# Patient Record
Sex: Female | Born: 1975 | Race: White | Hispanic: No | Smoking: Never smoker
Health system: Southern US, Community
[De-identification: ages and names within clinical notes are randomized; demographics above are authoritative.]

---

## 1998-02-23 ENCOUNTER — Other Ambulatory Visit: Admission: RE | Admit: 1998-02-23 | Discharge: 1998-02-23 | Payer: Self-pay | Admitting: Obstetrics and Gynecology

## 2001-01-25 ENCOUNTER — Other Ambulatory Visit: Admission: RE | Admit: 2001-01-25 | Discharge: 2001-01-25 | Payer: Self-pay | Admitting: Gynecology

## 2001-09-13 ENCOUNTER — Encounter: Admission: RE | Admit: 2001-09-13 | Discharge: 2001-09-13 | Payer: Self-pay | Admitting: Internal Medicine

## 2001-09-13 ENCOUNTER — Encounter: Payer: Self-pay | Admitting: Internal Medicine

## 2002-02-01 ENCOUNTER — Other Ambulatory Visit: Admission: RE | Admit: 2002-02-01 | Discharge: 2002-02-01 | Payer: Self-pay | Admitting: Gynecology

## 2003-02-24 ENCOUNTER — Other Ambulatory Visit: Admission: RE | Admit: 2003-02-24 | Discharge: 2003-02-24 | Payer: Self-pay | Admitting: Gynecology

## 2004-02-27 ENCOUNTER — Other Ambulatory Visit: Admission: RE | Admit: 2004-02-27 | Discharge: 2004-02-27 | Payer: Self-pay | Admitting: Gynecology

## 2005-02-28 ENCOUNTER — Other Ambulatory Visit: Admission: RE | Admit: 2005-02-28 | Discharge: 2005-02-28 | Payer: Self-pay | Admitting: Gynecology

## 2006-03-05 ENCOUNTER — Other Ambulatory Visit: Admission: RE | Admit: 2006-03-05 | Discharge: 2006-03-05 | Payer: Self-pay | Admitting: Gynecology

## 2007-06-24 ENCOUNTER — Inpatient Hospital Stay (HOSPITAL_COMMUNITY): Admission: AD | Admit: 2007-06-24 | Discharge: 2007-06-25 | Payer: Self-pay | Admitting: Obstetrics and Gynecology

## 2007-10-13 ENCOUNTER — Inpatient Hospital Stay (HOSPITAL_COMMUNITY): Admission: RE | Admit: 2007-10-13 | Discharge: 2007-10-16 | Payer: Self-pay | Admitting: Obstetrics and Gynecology

## 2007-10-17 ENCOUNTER — Encounter: Admission: RE | Admit: 2007-10-17 | Discharge: 2007-11-16 | Payer: Self-pay | Admitting: Obstetrics and Gynecology

## 2007-11-17 ENCOUNTER — Encounter: Admission: RE | Admit: 2007-11-17 | Discharge: 2007-12-06 | Payer: Self-pay | Admitting: Obstetrics and Gynecology

## 2008-11-22 ENCOUNTER — Emergency Department (HOSPITAL_COMMUNITY): Admission: EM | Admit: 2008-11-22 | Discharge: 2008-11-22 | Payer: Self-pay | Admitting: Emergency Medicine

## 2011-02-04 NOTE — H&P (Signed)
NAME:  Amy Griffith NO.:  [id number]   MEDICAL RECORD NO.:  [id number]          PATIENT TYPE:  INP   LOCATION:  9130                          FACILITY:  [facility]   PHYSICIAN:  Zelphia Cairo, MD    DATE OF BIRTH:  29-Jun-1976   DATE OF ADMISSION:  10/13/2007  DATE OF DISCHARGE:                              HISTORY & PHYSICAL   HISTORY OF PRESENT ILLNESS:  A 35 year old G1, P0 white female at [redacted]  weeks gestation, who presents today for a primary cesarean section. She  has a history of vestibuloplasty for treatment of dyspareunia. Her  pregnancy has otherwise been uncomplicated.   PAST MEDICAL HISTORY:  Includes migraines.   PAST SURGICAL HISTORY:  Vestibuloplasty.   ALLERGIES:  CODEINE, SULFA, IODINE.   MEDICATIONS:  Prenatal vitamins.   FAMILY HISTORY:  Noncontributory.   PRENATAL CARE/LABORATORY:  Please see [facility].   PHYSICAL EXAMINATION:  VITAL SIGNS:  Afebrile. Blood pressure 120/70.  Vital signs stable. Fetal heart tones are present.  HEART:  Regular rate and rhythm.  LUNGS:  Clear bilaterally.  ABDOMEN:  Soft, gravid, and nontender.  GENITOURINARY:  Cervical examination was deferred.  EXTREMITIES:  Without clubbing, cyanosis, or edema.   ASSESSMENT/PLAN:  A 35 year old G1, P0 with history of vestibuloplasty.  Risks, benefits, and alternatives to primary cesarean section for mode  of delivery were discussed with the patient and informed consent was  obtained.      Zelphia Cairo, MD  Electronically Signed     GA/MEDQ  D:  10/12/2007  T:  10/12/2007  Job:  [id number]

## 2011-02-04 NOTE — Discharge Summary (Signed)
NAME:  Amy Griffith, Amy Griffith           ACCOUNT NO.:  [id number]   MEDICAL RECORD NO.:  [id number]          PATIENT TYPE:  INP   LOCATION:  9130                          [facility]:  [facility]   PHYSICIAN:  Michelle L. Grewal, M.D.DATE OF BIRTH:  15-Jul-1976   DATE OF ADMISSION:  10/13/2007  DATE OF DISCHARGE:  10/16/2007                               DISCHARGE SUMMARY   ADMITTING DIAGNOSES:  1. Intrauterine pregnancy at 20 weeks estimated gestational age.  2. History of dyspareunia subsequent with vestibuloplasty.   DISCHARGE DIAGNOSES:  1. Status post low transverse cesarean section.  2. Viable female infant.   PROCEDURE:  Primary low transverse cesarean section.   REASON FOR ADMISSION:  See dictated H and P.   HOSPITAL COURSE:  The patient is a 35 year old primigravida who was  admitted to [facility] at 39 weeks estimated gestational age for  scheduled cesarean section.  The patient had a history of a  vestibuloplasty for treatment of dyspareunia and had desired a primary  cesarean delivery.  On the morning of admission the patient was taken to  the operating room where spinal anesthesia was administered without  difficulty.  A low transverse incision was made with delivery of a  viable female infant with Apgar of 9 at one minute and 9 at five  minutes.  Arterial cord pH was 7.35.  The patient tolerated the  procedure well and was taken to the recovery room in stable condition.   On postoperative day 1 the patient was without complaint.  Vital signs  are stable.  She is afebrile.  Abdomen soft, fundus firm and nontender.  Abdominal dressing is noted to be clean, dry, and intact.  Foley had  been discontinued and the patient was voiding well.  Laboratory findings  revealed hemoglobin of 9.5, platelets of 134,000, WBC count of 11.2.   On postoperative day 2 the patient was without complaints.  Vital signs  were showing stable.  She was afebrile.  Fundus firm and nontender.  Incision  was clean, dry and intact.  She is ambulating well, tolerating  a regular diet without complaints of nausea and vomiting.  On  postoperative day #3 the patient was without complaints, vital signs  remained stable.  She is afebrile.  Fundus firm and nontender.  Incision  was clean, dry and intact.  The staples removed.  The patient was later  discharged home.   CONDITION ON DISCHARGE:  Stable.   Diet, regular as tolerated.  Activity, no heavy lifting, no driving x2  weeks.  No vaginal entry.   FOLLOWUP:  The patient to follow up in the office in 1-2 weeks for  incision check.  She is to call for temperature greater than 100  degrees, persistent vomiting, heavy vaginal bleeding, and/or redness or  drainage from the incisional site.   DISCHARGE MEDICATIONS:  1. Tylox #30, 1 p.o. every 4-6 h. p.r.n.  2. __________.  3. Prenatal vitamins 1 p.o. daily.  4. Colace 1 p.o. daily.      Julio Sicks, N.P.      Stann Mainland. Vincente Poli, M.D.  Electronically Signed  CC/MEDQ  D:  10/28/2007  T:  10/28/2007  Job:  [id number]

## 2011-02-04 NOTE — Op Note (Signed)
NAME:  Amy Griffith, Amy Griffith           ACCOUNT NO.:  [id number]   MEDICAL RECORD NO.:  [id number]          PATIENT TYPE:  INP   LOCATION:  9130                          [facility]   PHYSICIAN:  Zelphia Cairo, MD    DATE OF BIRTH:  08-26-1976   DATE OF PROCEDURE:  10/13/2007  DATE OF DISCHARGE:                               OPERATIVE REPORT   PREOPERATIVE DIAGNOSES:  1. History of perineoplasty.  2. Intrauterine pregnancy at 39 weeks.   POSTOPERATIVE DIAGNOSES:  1. History of perineoplasty.  2. Intrauterine pregnancy at 39 weeks.   PROCEDURE:  Primary low transverse cesarean delivery.   SURGEON:  Zelphia Cairo, MD   ANESTHESIA:  Spinal.   FINDINGS:  Viable female infant with Apgars of 9 and 9, cord pH of 7.35,  posterior pedunculated uterine fibroid, normal-appearing bilateral  adnexa.   ESTIMATED BLOOD LOSS:  500 mL.   URINE OUTPUT:  300 mL.   COMPLICATIONS:  None.   CONDITION:  Condition stable to recovery room.   PROCEDURE:  The patient was taken to the operating room where spinal  anesthesia was found to be adequate.  She was placed in the supine  position with a left tilt.  She was prepped and draped in sterile  fashion and a Foley catheter was inserted sterilely.  Pfannenstiel skin  incision was made with a scalpel and this was extended to the underlying  fascia.  The fascia was incised in the midline.  This was extended  laterally with Mayo scissors.  Kocher clamps were used to grasp the  anterior portion of the fascia.  This was tented upwards and the  underlying rectus muscles were dissected off using the Bovie.  The  inferior portion of the fascia was grasped with Kocher clamps, tented  upwards and the underlying rectus muscles were dissected off using the  Bovie.  Peritoneum was then identified and tented upwards with  hemostats.  The peritoneum was then entered sharply with Metzenbaum  scissors.  This was extended superiorly and inferiorly with good  visualization of the bladder.  Bladder blade was then inserted.  Vesicouterine peritoneum was incised and the bladder flap was created.  Bladder blade was then reinserted to protect the bladder flap.   The uterine incision was then made with a scalpel and extended using my  fingers.  Clear fluid was noted.  Fetal vertex was brought to the  uterine incision and delivered with the aid of fundal pressure.  The  mouth and nose were suctioned and the shoulders and body easily  followed.  The cord was clamped and cut and the infant was taken to the  awaiting pediatric staff.  The placenta was then manually removed from  the uterus.  The uterus was cleared of all clots and debris using a dry  lap sponge and the uterus was exteriorized from the pelvis.  The uterus  was closed in double layer fashion using 0-0 chromic.  Once hemostasis  was assured, the uterus was placed back into the pelvic cavity and the  pelvis was copiously irrigated with warm normal saline.  The uterine  incision  was reinspected and again found to be hemostatic.  Peritoneum  was closed with 0-0 Monocryl.  The fascia was closed with a looped 0-0  PDS and the skin was closed with staples.  The patient tolerated the  procedure well.  Sponge, lap, needle and instrument counts were correct  x2.  She was taken to the recovery room in stable condition.      Zelphia Cairo, MD  Electronically Signed     GA/MEDQ  D:  10/13/2007  T:  10/13/2007  Job:  [id number]

## 2011-02-07 NOTE — Discharge Summary (Signed)
NAME:  Amy Griffith, Amy Griffith           ACCOUNT NO.:  [id number]   MEDICAL RECORD NO.:  [id number]          PATIENT TYPE:  INP   LOCATION:  9130                          [facility]   PHYSICIAN:  Michelle L. Grewal, M.D.DATE OF BIRTH:  [birth year]   DATE OF ADMISSION:  10/13/2007  DATE OF DISCHARGE:  10/16/2007                               DISCHARGE SUMMARY   NO DICTATION      Julio Sicks, N.P.      Stann Mainland. Vincente Poli, M.D.  Electronically Signed    CC/MEDQ  D:  10/28/2007  T:  10/28/2007  Job:  [id number]

## 2011-06-12 LAB — CBC
HCT: 27.2 — ABNORMAL LOW
Hemoglobin: 12.3
Hemoglobin: 9.5 — ABNORMAL LOW
MCHC: 34.9
MCV: 91.3
MCV: 91.8
RBC: 3.82 — ABNORMAL LOW
RDW: 14.3
WBC: 11.2 — ABNORMAL HIGH

## 2011-06-12 LAB — ABO/RH: ABO/RH(D): O POS

## 2011-06-12 LAB — RPR: RPR Ser Ql: NONREACTIVE

## 2011-07-03 LAB — URINALYSIS, ROUTINE W REFLEX MICROSCOPIC
Bilirubin Urine: NEGATIVE
Hgb urine dipstick: NEGATIVE
Specific Gravity, Urine: <1.005 — ABNORMAL LOW
pH: 7

## 2014-05-12 ENCOUNTER — Encounter: Payer: Self-pay | Admitting: Podiatrist

## 2014-05-12 ENCOUNTER — Ambulatory Visit (INDEPENDENT_AMBULATORY_CARE_PROVIDER_SITE_OTHER): Payer: Managed Care, Other (non HMO) | Admitting: Podiatrist

## 2014-05-12 VITALS — BP 112/72 | HR 73 | Resp 18

## 2014-05-12 DIAGNOSIS — Q828 Other specified congenital malformations of skin: Secondary | ICD-10-CM

## 2014-05-12 DIAGNOSIS — B351 Tinea unguium: Secondary | ICD-10-CM

## 2014-05-12 DIAGNOSIS — B353 Tinea pedis: Secondary | ICD-10-CM

## 2014-05-12 MED ORDER — EFINACONAZOLE 10 % EX SOLN: 1.0000 [drp] | Freq: Every day | CUTANEOUS | Status: DC

## 2014-05-12 MED ORDER — NAFTIFINE HCL 2 % EX CREA: 1.0000 "application " | TOPICAL_CREAM | CUTANEOUS | Status: AC

## 2014-05-12 NOTE — Progress Notes (Signed)
   Subjective:    Patient ID: Amy Griffith, female    DOB: Mar 11, 1976, 38 y.o.   MRN: [id number]  HPI i have a wart on the ball of my right foot and has been there several months and used to hurt and i have some toenails that are discolored and the 2nd toe on the left has been draining some    Review of Systems  Skin:       Change in nails  Neurological: Positive for headaches.  All other systems reviewed and are negative.      Objective:   Physical Exam Patient is awake, alert, and oriented x 3.  In no acute distress.  Vascular status is intact with palpable pedal pulses at 2/4 DP and PT bilateral and capillary refill time within normal limits. Neurological sensation is also intact bilaterally via Semmes Weinstein monofilament at 5/5 sites. Light touch, vibratory sensation, Achilles tendon reflex is intact. Dermatological exam reveals skin color, turger and texture as normal. No open lesions present.  Musculature intact with dorsiflexion, plantarflexion, inversion, eversion.  Left hallux lateral nail border is ingrown incurvated. She has superficial white onychomycosis present as well. A prior blister was present on the second digit left foot and appears to be healing well. No sign of infection is present. A small foreign body or porokeratotic lesion is present submetatarsal 1 right foot.     Assessment & Plan:   Left first ingrown lateral corner-- and superficial white mycosis,  Previous blister 2nd toe left.  Foreign body or poro submet 1 right.   Plan: Recommended topical antifungal for the nail. Also excised the soft tissue lesion with a #15 blade without complication. She'll be seen back as needed for followup if any problems or concerns arise she will call.

## 2014-05-12 NOTE — Patient Instructions (Signed)
Salicylic Acid topical gel, cream, lotion, solution What is this medicine? SALICYCLIC ACID (SAL i SIL ik AS id) breaks down layers of thick skin. It is used to treat common and plantar warts, psoriasis, calluses, and corns. It is also used to treat or to prevent acne. This medicine may be used for other purposes; ask your health care provider or pharmacist if you have questions. COMMON BRAND NAME(S): Azucena FallenAkurza, Clear Away Liquid, Compound W, Corn/Callus Remover, Dermarest Psoriasis Moisturizer, Dermarest Psoriasis Overnight Treatment, Dermarest Psoriasis Scalp Treatment, Dermarest Psoriasis Skin Treatment, DuoFilm Wart Remover, Gordofilm, Hydrisalic, Keralyt, Neutrogena Acne Wash, PeachamOcclusal-HP, RE SA, [address]AC, Celanese CorporationSalactic Film, [place]Salacyn, [place], [place]Sal[place], [place]Scalpicin 2 in 1 Anti-Dandruff, [place]UltraSal-ER, [place]VIRASAL, Wart-Off What should I tell my health care provider before I take this medicine? They need to know if you have any of these conditions: -child with chickenpox, the flu, or other viral infection -kidney disease -liver disease -an unusual or allergic reaction to salicylic acid, other medicines, foods, dyes, or preservatives -pregnant or trying to get pregnant -breast-feeding   How should I use this medicine?-- apply a dab of the liquid or gel medication directly to the center of the lesion.  Do this every day for 3-4 days or until the skin turns white.  After it turns white, soak in warm soapy water for 20 minutes. Then the skin should be soft and you can pull off the dead skin.     This medicine is for external use only. Follow the directions on the label. Do not apply to raw or irritated skin. Avoid getting medicine in your eyes, lips, nose, mouth, or other sensitive areas. Use this medicine at regular intervals. Do not use more often than directed.

## 2014-05-18 ENCOUNTER — Other Ambulatory Visit: Payer: Self-pay | Admitting: *Deleted

## 2014-05-18 MED ORDER — EFINACONAZOLE 10 % EX SOLN: 1.0000 [drp] | Freq: Every day | CUTANEOUS | Status: AC

## 2014-05-18 NOTE — Telephone Encounter (Signed)
[facility] sent a request on behalf of patient stating that they have not received prescription for jublia , rewrote prescription to send [facility]

## 2019-09-05 ENCOUNTER — Other Ambulatory Visit: Payer: Self-pay

## 2019-09-05 DIAGNOSIS — Z20822 Contact with and (suspected) exposure to covid-19: Secondary | ICD-10-CM

## 2019-09-07 LAB — NOVEL CORONAVIRUS, NAA: SARS-CoV-2, NAA: NOT DETECTED

## 2020-08-22 DIAGNOSIS — Z9189 Other specified personal risk factors, not elsewhere classified: Secondary | ICD-10-CM | POA: Diagnosis not present

## 2020-08-27 ENCOUNTER — Other Ambulatory Visit: Payer: Self-pay | Admitting: Obstetrics and Gynecology

## 2020-08-27 DIAGNOSIS — Z9189 Other specified personal risk factors, not elsewhere classified: Secondary | ICD-10-CM

## 2020-09-12 ENCOUNTER — Other Ambulatory Visit: Payer: Self-pay

## 2020-09-12 ENCOUNTER — Ambulatory Visit
Admission: RE | Admit: 2020-09-12 | Discharge: 2020-09-12 | Disposition: A | Discharge status: Home / Self Care | Payer: BC Managed Care – PPO | Source: Ambulatory Visit | Attending: Obstetrics and Gynecology | Admitting: Obstetrics and Gynecology

## 2020-09-12 DIAGNOSIS — C50911 Malignant neoplasm of unspecified site of right female breast: Secondary | ICD-10-CM | POA: Diagnosis not present

## 2020-09-12 DIAGNOSIS — C50912 Malignant neoplasm of unspecified site of left female breast: Secondary | ICD-10-CM | POA: Diagnosis not present

## 2020-09-12 DIAGNOSIS — Z9189 Other specified personal risk factors, not elsewhere classified: Secondary | ICD-10-CM

## 2020-09-12 MED ORDER — GADOBUTROL 1 MMOL/ML IV SOLN
8.0000 mL | Freq: Once | INTRAVENOUS | Status: AC | PRN
  Administered 2020-09-12: 8 mL via INTRAVENOUS

## 2020-09-13 ENCOUNTER — Other Ambulatory Visit: Payer: Self-pay | Admitting: Obstetrics and Gynecology

## 2020-09-13 DIAGNOSIS — R9389 Abnormal findings on diagnostic imaging of other specified body structures: Secondary | ICD-10-CM

## 2020-09-26 ENCOUNTER — Ambulatory Visit
Admission: RE | Admit: 2020-09-26 | Discharge: 2020-09-26 | Disposition: A | Discharge status: Home / Self Care | Payer: BC Managed Care – PPO | Source: Ambulatory Visit | Attending: Obstetrics and Gynecology | Admitting: Obstetrics and Gynecology

## 2020-09-26 ENCOUNTER — Other Ambulatory Visit: Payer: Self-pay

## 2020-09-26 ENCOUNTER — Other Ambulatory Visit (HOSPITAL_COMMUNITY): Payer: Self-pay | Admitting: Diagnostic Radiology

## 2020-09-26 DIAGNOSIS — R9389 Abnormal findings on diagnostic imaging of other specified body structures: Secondary | ICD-10-CM

## 2020-09-26 DIAGNOSIS — R928 Other abnormal and inconclusive findings on diagnostic imaging of breast: Secondary | ICD-10-CM | POA: Diagnosis not present

## 2020-09-26 MED ORDER — GADOBUTROL 1 MMOL/ML IV SOLN
7.0000 mL | Freq: Once | INTRAVENOUS | Status: AC | PRN
  Administered 2020-09-26: 7 mL via INTRAVENOUS

## 2021-03-19 DIAGNOSIS — B001 Herpesviral vesicular dermatitis: Secondary | ICD-10-CM | POA: Diagnosis not present

## 2021-03-19 DIAGNOSIS — B070 Plantar wart: Secondary | ICD-10-CM | POA: Diagnosis not present

## 2021-06-03 DIAGNOSIS — Z01419 Encounter for gynecological examination (general) (routine) without abnormal findings: Secondary | ICD-10-CM | POA: Diagnosis not present

## 2021-06-03 DIAGNOSIS — B300 Keratoconjunctivitis due to adenovirus: Secondary | ICD-10-CM | POA: Diagnosis not present

## 2021-06-03 DIAGNOSIS — Z6825 Body mass index (BMI) 25.0-25.9, adult: Secondary | ICD-10-CM | POA: Diagnosis not present

## 2021-06-03 DIAGNOSIS — Z1231 Encounter for screening mammogram for malignant neoplasm of breast: Secondary | ICD-10-CM | POA: Diagnosis not present

## 2021-06-07 DIAGNOSIS — H02402 Unspecified ptosis of left eyelid: Secondary | ICD-10-CM | POA: Diagnosis not present

## 2021-06-10 DIAGNOSIS — H02402 Unspecified ptosis of left eyelid: Secondary | ICD-10-CM | POA: Diagnosis not present

## 2021-07-02 ENCOUNTER — Other Ambulatory Visit: Payer: Self-pay | Admitting: Obstetrics and Gynecology

## 2021-07-02 DIAGNOSIS — D2239 Melanocytic nevi of other parts of face: Secondary | ICD-10-CM | POA: Diagnosis not present

## 2021-07-02 DIAGNOSIS — D2261 Melanocytic nevi of right upper limb, including shoulder: Secondary | ICD-10-CM | POA: Diagnosis not present

## 2021-07-02 DIAGNOSIS — D485 Neoplasm of uncertain behavior of skin: Secondary | ICD-10-CM | POA: Diagnosis not present

## 2021-07-02 DIAGNOSIS — Z9189 Other specified personal risk factors, not elsewhere classified: Secondary | ICD-10-CM

## 2021-07-02 DIAGNOSIS — D225 Melanocytic nevi of trunk: Secondary | ICD-10-CM | POA: Diagnosis not present

## 2021-07-02 DIAGNOSIS — D224 Melanocytic nevi of scalp and neck: Secondary | ICD-10-CM | POA: Diagnosis not present

## 2021-07-09 DIAGNOSIS — H02402 Unspecified ptosis of left eyelid: Secondary | ICD-10-CM | POA: Diagnosis not present

## 2021-07-15 DIAGNOSIS — K602 Anal fissure, unspecified: Secondary | ICD-10-CM | POA: Diagnosis not present

## 2021-07-15 DIAGNOSIS — R195 Other fecal abnormalities: Secondary | ICD-10-CM | POA: Diagnosis not present

## 2021-07-15 DIAGNOSIS — K649 Unspecified hemorrhoids: Secondary | ICD-10-CM | POA: Diagnosis not present

## 2021-07-16 DIAGNOSIS — R195 Other fecal abnormalities: Secondary | ICD-10-CM | POA: Diagnosis not present

## 2021-07-23 ENCOUNTER — Other Ambulatory Visit: Payer: BC Managed Care – PPO

## 2021-08-23 DIAGNOSIS — E7800 Pure hypercholesterolemia, unspecified: Secondary | ICD-10-CM | POA: Diagnosis not present

## 2021-09-13 IMAGING — MG MM BREAST LOCALIZATION CLIP
4 series · 4 of 12 positions shown · non-contrast
Comparison: Previous exam(s).

CLINICAL DATA: Status post MR guided core biopsy of non mass
enhancement in the UPPER-OUTER QUADRANT of the LEFT breast.

EXAM:
3D DIAGNOSTIC LEFT MAMMOGRAM POST MRI BIOPSY

[L ML synth-2D]
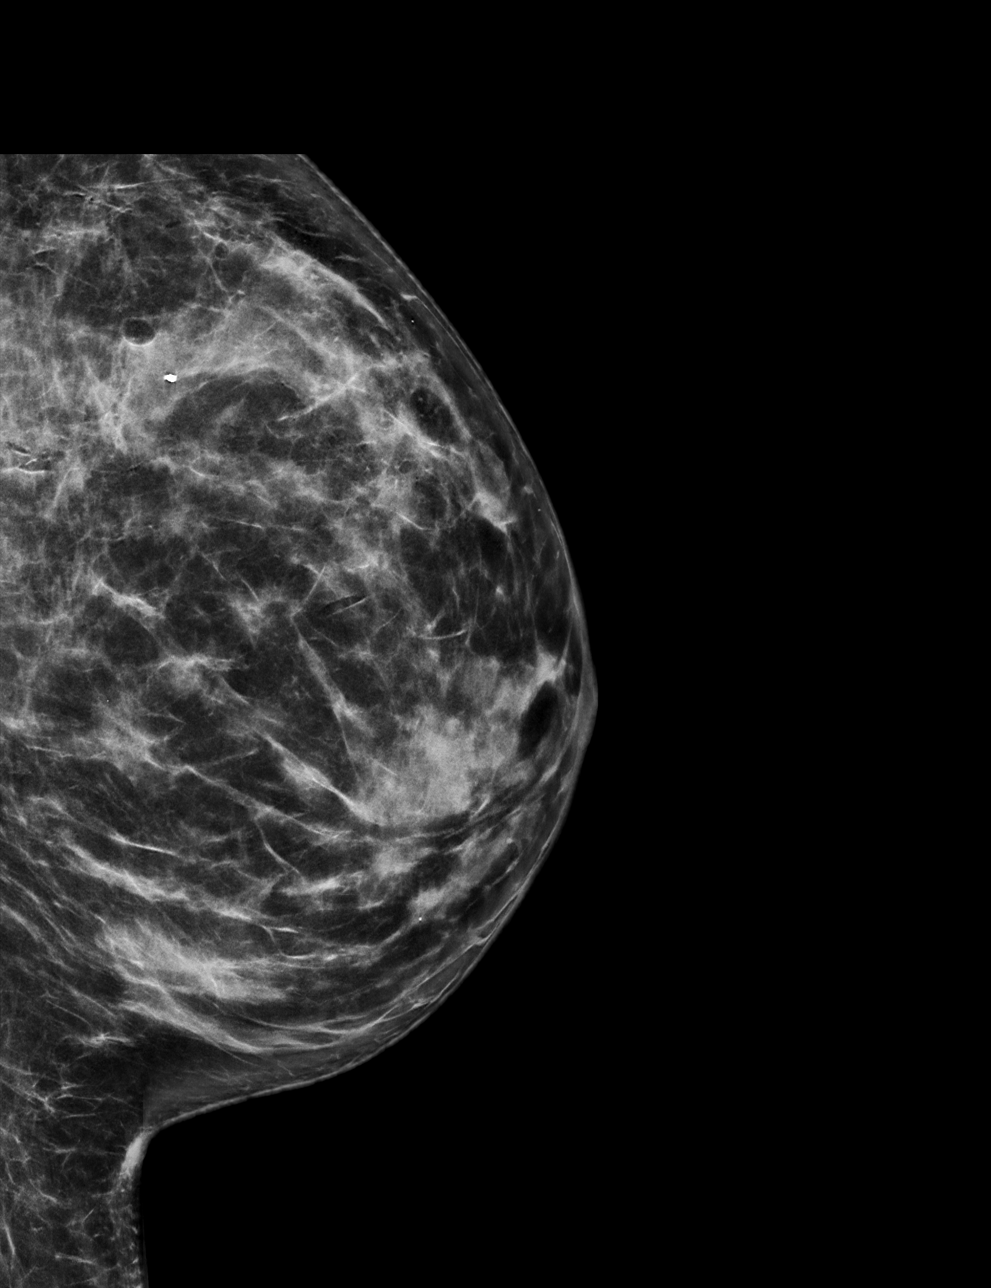

[L CC synth-2D]
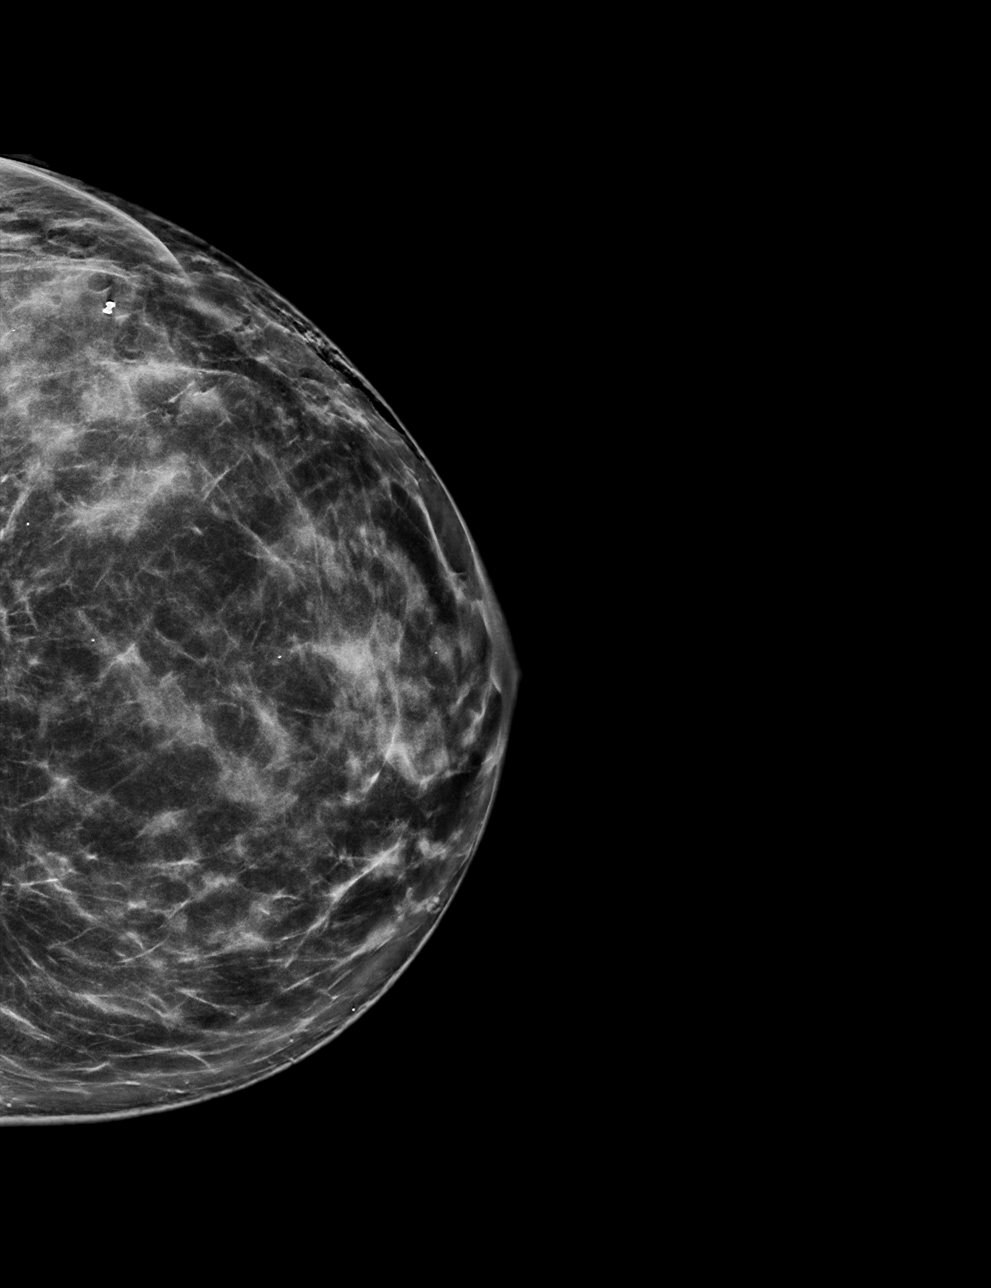

[L ML tomo · tomo slice 35/69.0]
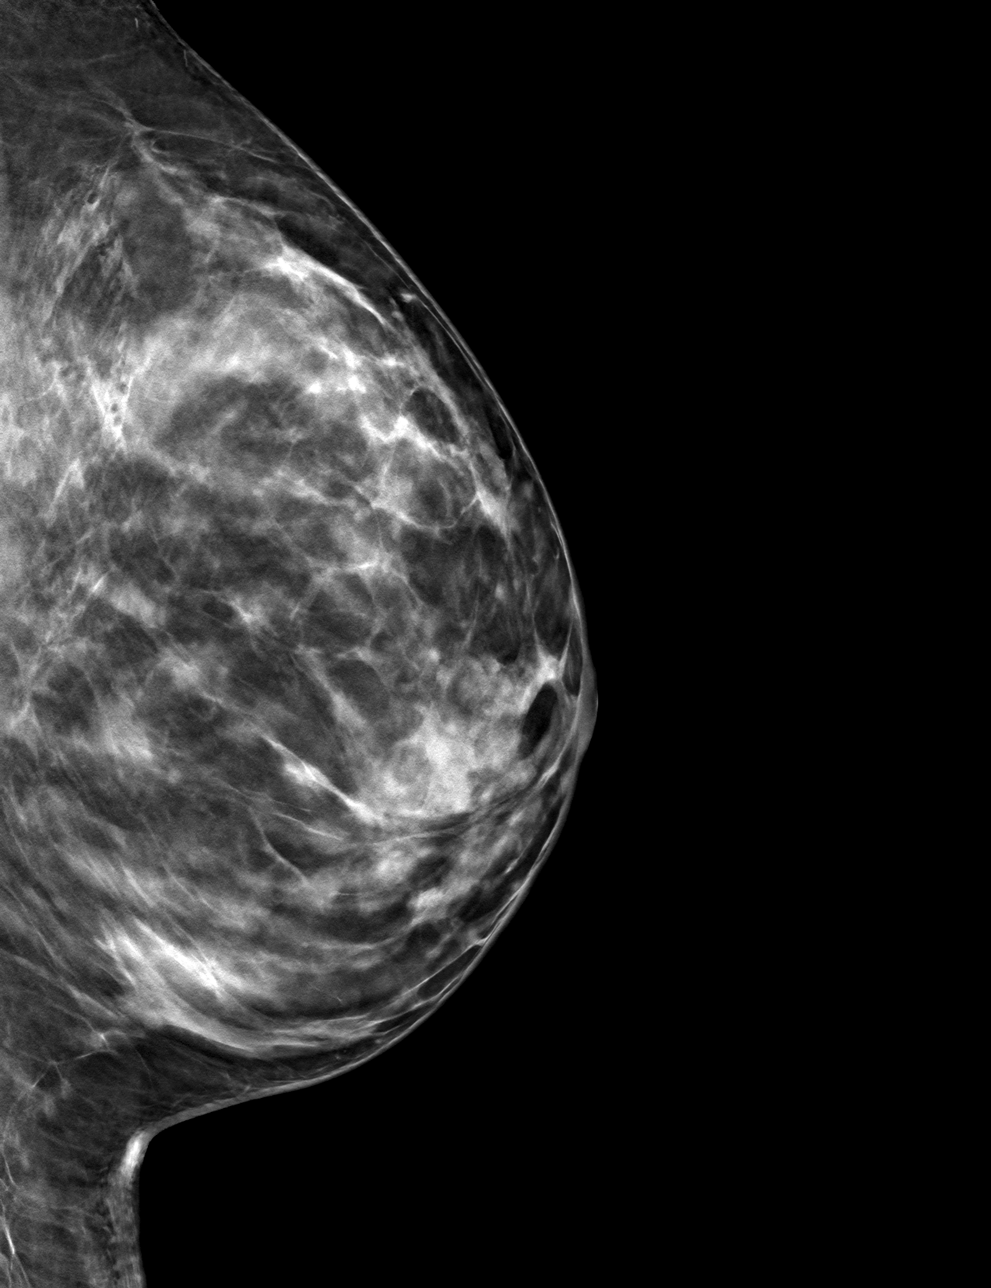

[L CC tomo · tomo slice 38/75.0]
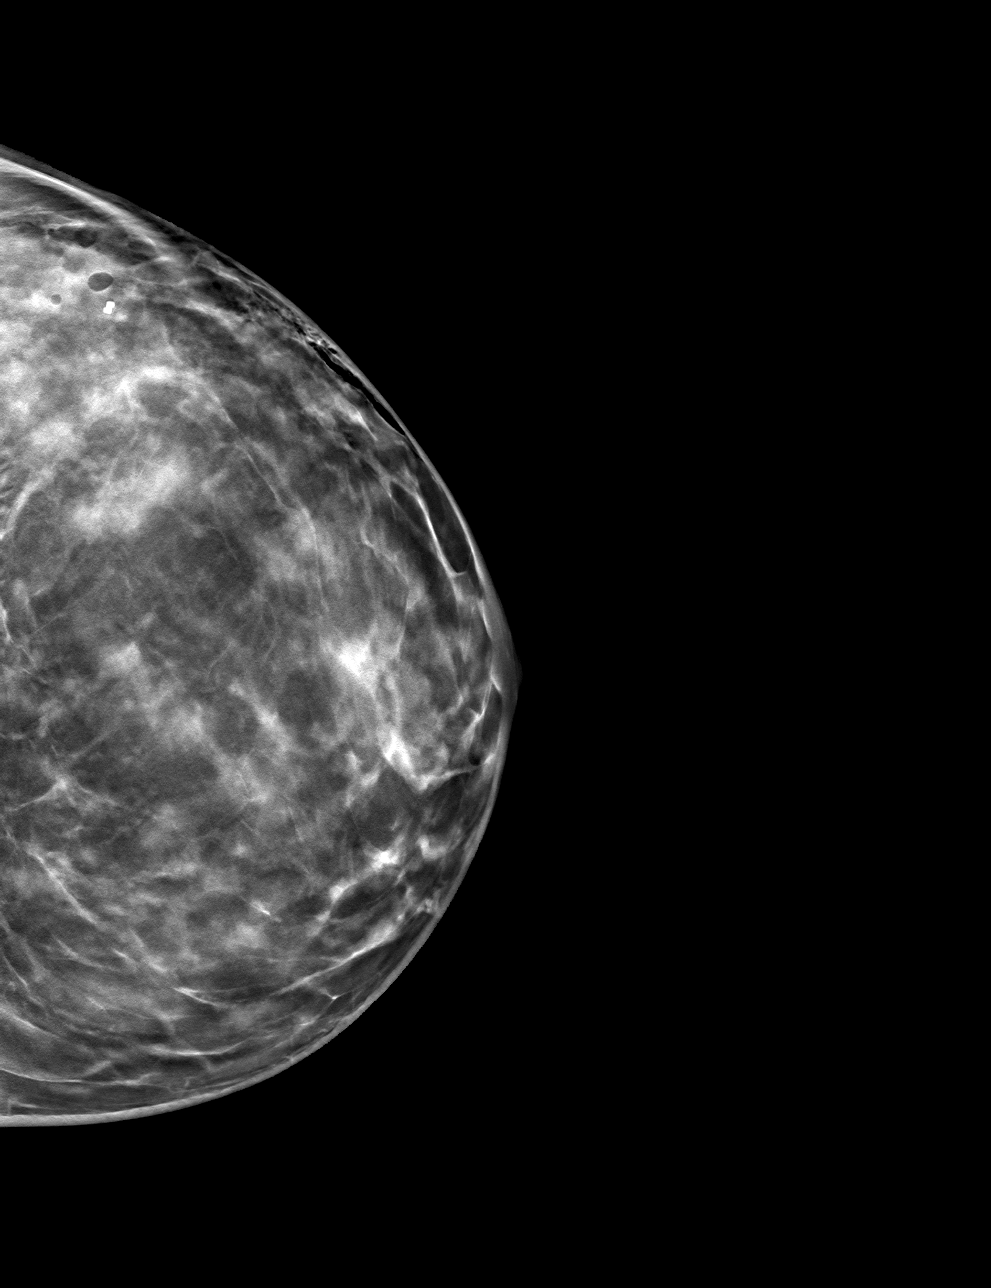

[4 of 12 positions shown; findings below may reference images not displayed]

FINDINGS: 3D Mammographic images were obtained following MRI guided biopsy of
non mass enhancement in the UPPER-OUTER QUADRANT of the LEFT breast
and placement of a barbell shaped clip. The biopsy marking clip is
in expected position at the site of biopsy.
IMPRESSION: Appropriate positioning of the barbell shaped biopsy marking clip at
the site of biopsy in the UPPER-OUTER QUADRANT LEFT breast.

Final Assessment: Post Procedure Mammograms for Marker Placement

## 2021-10-25 DIAGNOSIS — D122 Benign neoplasm of ascending colon: Secondary | ICD-10-CM | POA: Diagnosis not present

## 2021-10-25 DIAGNOSIS — Z1211 Encounter for screening for malignant neoplasm of colon: Secondary | ICD-10-CM | POA: Diagnosis not present

## 2021-10-25 DIAGNOSIS — D123 Benign neoplasm of transverse colon: Secondary | ICD-10-CM | POA: Diagnosis not present

## 2022-01-08 DIAGNOSIS — D2239 Melanocytic nevi of other parts of face: Secondary | ICD-10-CM | POA: Diagnosis not present

## 2022-01-08 DIAGNOSIS — D2261 Melanocytic nevi of right upper limb, including shoulder: Secondary | ICD-10-CM | POA: Diagnosis not present

## 2022-01-08 DIAGNOSIS — L910 Hypertrophic scar: Secondary | ICD-10-CM | POA: Diagnosis not present

## 2022-01-08 DIAGNOSIS — D225 Melanocytic nevi of trunk: Secondary | ICD-10-CM | POA: Diagnosis not present

## 2022-01-08 DIAGNOSIS — B078 Other viral warts: Secondary | ICD-10-CM | POA: Diagnosis not present

## 2022-01-08 DIAGNOSIS — D2262 Melanocytic nevi of left upper limb, including shoulder: Secondary | ICD-10-CM | POA: Diagnosis not present

## 2022-01-14 DIAGNOSIS — M5416 Radiculopathy, lumbar region: Secondary | ICD-10-CM | POA: Diagnosis not present

## 2022-02-19 DIAGNOSIS — M545 Low back pain, unspecified: Secondary | ICD-10-CM | POA: Diagnosis not present

## 2022-03-24 DIAGNOSIS — M545 Low back pain, unspecified: Secondary | ICD-10-CM | POA: Diagnosis not present

## 2022-06-12 DIAGNOSIS — Z1231 Encounter for screening mammogram for malignant neoplasm of breast: Secondary | ICD-10-CM | POA: Diagnosis not present

## 2022-06-12 DIAGNOSIS — Z01419 Encounter for gynecological examination (general) (routine) without abnormal findings: Secondary | ICD-10-CM | POA: Diagnosis not present

## 2022-06-12 DIAGNOSIS — Z6825 Body mass index (BMI) 25.0-25.9, adult: Secondary | ICD-10-CM | POA: Diagnosis not present

## 2022-06-12 DIAGNOSIS — Z1151 Encounter for screening for human papillomavirus (HPV): Secondary | ICD-10-CM | POA: Diagnosis not present

## 2022-06-12 DIAGNOSIS — Z124 Encounter for screening for malignant neoplasm of cervix: Secondary | ICD-10-CM | POA: Diagnosis not present

## 2022-06-16 ENCOUNTER — Other Ambulatory Visit: Payer: Self-pay | Admitting: Obstetrics and Gynecology

## 2022-06-16 DIAGNOSIS — R928 Other abnormal and inconclusive findings on diagnostic imaging of breast: Secondary | ICD-10-CM

## 2022-06-26 ENCOUNTER — Ambulatory Visit
Admission: RE | Admit: 2022-06-26 | Discharge: 2022-06-26 | Disposition: A | Discharge status: Home / Self Care | Payer: BC Managed Care – PPO | Source: Ambulatory Visit | Attending: Obstetrics and Gynecology | Admitting: Obstetrics and Gynecology

## 2022-06-26 ENCOUNTER — Other Ambulatory Visit: Payer: Self-pay | Admitting: Obstetrics and Gynecology

## 2022-06-26 DIAGNOSIS — Z803 Family history of malignant neoplasm of breast: Secondary | ICD-10-CM | POA: Diagnosis not present

## 2022-06-26 DIAGNOSIS — R928 Other abnormal and inconclusive findings on diagnostic imaging of breast: Secondary | ICD-10-CM

## 2022-06-26 DIAGNOSIS — N6489 Other specified disorders of breast: Secondary | ICD-10-CM

## 2022-07-10 ENCOUNTER — Ambulatory Visit
Admission: RE | Admit: 2022-07-10 | Discharge: 2022-07-10 | Disposition: A | Discharge status: Home / Self Care | Payer: BC Managed Care – PPO | Source: Ambulatory Visit | Attending: Obstetrics and Gynecology | Admitting: Obstetrics and Gynecology

## 2022-07-10 DIAGNOSIS — N6489 Other specified disorders of breast: Secondary | ICD-10-CM

## 2022-07-10 DIAGNOSIS — N6011 Diffuse cystic mastopathy of right breast: Secondary | ICD-10-CM | POA: Diagnosis not present

## 2022-07-10 DIAGNOSIS — R928 Other abnormal and inconclusive findings on diagnostic imaging of breast: Secondary | ICD-10-CM | POA: Diagnosis not present

## 2022-09-30 DIAGNOSIS — D225 Melanocytic nevi of trunk: Secondary | ICD-10-CM | POA: Diagnosis not present

## 2022-09-30 DIAGNOSIS — D485 Neoplasm of uncertain behavior of skin: Secondary | ICD-10-CM | POA: Diagnosis not present

## 2022-09-30 DIAGNOSIS — D2262 Melanocytic nevi of left upper limb, including shoulder: Secondary | ICD-10-CM | POA: Diagnosis not present

## 2022-09-30 DIAGNOSIS — D2261 Melanocytic nevi of right upper limb, including shoulder: Secondary | ICD-10-CM | POA: Diagnosis not present

## 2022-09-30 DIAGNOSIS — L910 Hypertrophic scar: Secondary | ICD-10-CM | POA: Diagnosis not present

## 2022-09-30 DIAGNOSIS — D224 Melanocytic nevi of scalp and neck: Secondary | ICD-10-CM | POA: Diagnosis not present

## 2022-09-30 DIAGNOSIS — B079 Viral wart, unspecified: Secondary | ICD-10-CM | POA: Diagnosis not present

## 2022-11-27 ENCOUNTER — Other Ambulatory Visit: Payer: Self-pay | Admitting: Obstetrics and Gynecology

## 2022-11-27 DIAGNOSIS — N6091 Unspecified benign mammary dysplasia of right breast: Secondary | ICD-10-CM

## 2022-12-03 DIAGNOSIS — Z9189 Other specified personal risk factors, not elsewhere classified: Secondary | ICD-10-CM | POA: Diagnosis not present

## 2022-12-03 DIAGNOSIS — N6489 Other specified disorders of breast: Secondary | ICD-10-CM | POA: Diagnosis not present

## 2023-01-14 ENCOUNTER — Ambulatory Visit
Admission: RE | Admit: 2023-01-14 | Discharge: 2023-01-14 | Disposition: A | Discharge status: Home / Self Care | Payer: BC Managed Care – PPO | Source: Ambulatory Visit | Attending: Obstetrics and Gynecology | Admitting: Obstetrics and Gynecology

## 2023-01-14 ENCOUNTER — Ambulatory Visit: Admission: RE | Admit: 2023-01-14 | Payer: BC Managed Care – PPO | Source: Ambulatory Visit

## 2023-01-14 DIAGNOSIS — N6091 Unspecified benign mammary dysplasia of right breast: Secondary | ICD-10-CM

## 2023-01-14 DIAGNOSIS — R92331 Mammographic heterogeneous density, right breast: Secondary | ICD-10-CM | POA: Diagnosis not present

## 2023-06-17 DIAGNOSIS — D224 Melanocytic nevi of scalp and neck: Secondary | ICD-10-CM | POA: Diagnosis not present

## 2023-06-17 DIAGNOSIS — D2261 Melanocytic nevi of right upper limb, including shoulder: Secondary | ICD-10-CM | POA: Diagnosis not present

## 2023-06-17 DIAGNOSIS — B078 Other viral warts: Secondary | ICD-10-CM | POA: Diagnosis not present

## 2023-06-17 DIAGNOSIS — L918 Other hypertrophic disorders of the skin: Secondary | ICD-10-CM | POA: Diagnosis not present

## 2023-06-17 DIAGNOSIS — D2262 Melanocytic nevi of left upper limb, including shoulder: Secondary | ICD-10-CM | POA: Diagnosis not present

## 2023-06-18 DIAGNOSIS — Z1231 Encounter for screening mammogram for malignant neoplasm of breast: Secondary | ICD-10-CM | POA: Diagnosis not present

## 2023-06-18 DIAGNOSIS — Z01419 Encounter for gynecological examination (general) (routine) without abnormal findings: Secondary | ICD-10-CM | POA: Diagnosis not present

## 2023-06-18 DIAGNOSIS — Z6825 Body mass index (BMI) 25.0-25.9, adult: Secondary | ICD-10-CM | POA: Diagnosis not present

## 2023-08-25 DIAGNOSIS — M545 Low back pain, unspecified: Secondary | ICD-10-CM | POA: Diagnosis not present

## 2023-08-25 DIAGNOSIS — H6121 Impacted cerumen, right ear: Secondary | ICD-10-CM | POA: Diagnosis not present

## 2023-11-26 DIAGNOSIS — N912 Amenorrhea, unspecified: Secondary | ICD-10-CM | POA: Diagnosis not present

## 2023-11-26 DIAGNOSIS — N915 Oligomenorrhea, unspecified: Secondary | ICD-10-CM | POA: Diagnosis not present

## 2023-11-26 DIAGNOSIS — Z3202 Encounter for pregnancy test, result negative: Secondary | ICD-10-CM | POA: Diagnosis not present

## 2024-01-29 DIAGNOSIS — D224 Melanocytic nevi of scalp and neck: Secondary | ICD-10-CM | POA: Diagnosis not present

## 2024-01-29 DIAGNOSIS — D2262 Melanocytic nevi of left upper limb, including shoulder: Secondary | ICD-10-CM | POA: Diagnosis not present

## 2024-01-29 DIAGNOSIS — D2261 Melanocytic nevi of right upper limb, including shoulder: Secondary | ICD-10-CM | POA: Diagnosis not present

## 2024-01-29 DIAGNOSIS — L918 Other hypertrophic disorders of the skin: Secondary | ICD-10-CM | POA: Diagnosis not present

## 2024-06-20 DIAGNOSIS — Z6825 Body mass index (BMI) 25.0-25.9, adult: Secondary | ICD-10-CM | POA: Diagnosis not present

## 2024-06-20 DIAGNOSIS — Z01419 Encounter for gynecological examination (general) (routine) without abnormal findings: Secondary | ICD-10-CM | POA: Diagnosis not present

## 2024-06-20 DIAGNOSIS — Z1231 Encounter for screening mammogram for malignant neoplasm of breast: Secondary | ICD-10-CM | POA: Diagnosis not present
